# Patient Record
Sex: Male | Born: 2000 | Race: Black or African American | Hispanic: No | Marital: Single
Health system: Southern US, Community
[De-identification: ages and names within clinical notes are randomized; demographics above are authoritative.]

## PROBLEM LIST (undated history)

## (undated) DIAGNOSIS — G7100 Muscular dystrophy, unspecified: Secondary | ICD-10-CM

## (undated) DIAGNOSIS — J302 Other seasonal allergic rhinitis: Secondary | ICD-10-CM

---
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
## 2023-03-26 ENCOUNTER — Other Ambulatory Visit: Payer: Self-pay

## 2023-03-26 ENCOUNTER — Emergency Department (HOSPITAL_COMMUNITY): Payer: Medicaid Other

## 2023-03-26 ENCOUNTER — Emergency Department (HOSPITAL_COMMUNITY)
Admission: EM | Admit: 2023-03-26 | Discharge: 2023-03-26 | Disposition: A | Discharge status: Home / Self Care | Payer: Medicaid Other | Attending: Student | Admitting: Student

## 2023-03-26 ENCOUNTER — Encounter (HOSPITAL_COMMUNITY): Payer: Self-pay

## 2023-03-26 DIAGNOSIS — J101 Influenza due to other identified influenza virus with other respiratory manifestations: Secondary | ICD-10-CM | POA: Insufficient documentation

## 2023-03-26 DIAGNOSIS — Z20822 Contact with and (suspected) exposure to covid-19: Secondary | ICD-10-CM | POA: Insufficient documentation

## 2023-03-26 DIAGNOSIS — R059 Cough, unspecified: Secondary | ICD-10-CM | POA: Diagnosis present

## 2023-03-26 LAB — RESP PANEL BY RT-PCR (RSV, FLU A&B, COVID)  RVPGX2
Influenza A by PCR: POSITIVE — AB
Influenza B by PCR: NEGATIVE
Resp Syncytial Virus by PCR: NEGATIVE
SARS Coronavirus 2 by RT PCR: NEGATIVE

## 2023-03-26 LAB — COMPREHENSIVE METABOLIC PANEL
ALT: 42 U/L (ref 0–44)
AST: 36 U/L (ref 15–41)
Albumin: 4.5 g/dL (ref 3.5–5.0)
Alkaline Phosphatase: 44 U/L (ref 38–126)
Anion gap: 19 — ABNORMAL HIGH (ref 5–15)
BUN: <5 mg/dL — ABNORMAL LOW (ref 6–20)
CO2: 14 mmol/L — ABNORMAL LOW (ref 22–32)
Calcium: 9.4 mg/dL (ref 8.9–10.3)
Chloride: 106 mmol/L (ref 98–111)
Creatinine, Ser: 0.4 mg/dL — ABNORMAL LOW (ref 0.61–1.24)
GFR, Estimated: >60 mL/min (ref >60)
Glucose, Bld: 88 mg/dL (ref 70–99)
Potassium: 3.3 mmol/L — ABNORMAL LOW (ref 3.5–5.1)
Sodium: 139 mmol/L (ref 135–145)
Total Bilirubin: 1.3 mg/dL — ABNORMAL HIGH (ref 0.0–1.2)
Total Protein: 8 g/dL (ref 6.5–8.1)

## 2023-03-26 LAB — LIPASE, BLOOD: Lipase: 20 U/L (ref 11–51)

## 2023-03-26 LAB — CBC
HCT: 49.6 % (ref 39.0–52.0)
Hemoglobin: 16.8 g/dL (ref 13.0–17.0)
MCH: 30.7 pg (ref 26.0–34.0)
MCHC: 33.9 g/dL (ref 30.0–36.0)
MCV: 90.5 fL (ref 80.0–100.0)
Platelets: 264 10*3/uL (ref 150–400)
RBC: 5.48 MIL/uL (ref 4.22–5.81)
RDW: 13.1 % (ref 11.5–15.5)
WBC: 5.2 10*3/uL (ref 4.0–10.5)
nRBC: 0 % (ref 0.0–0.2)

## 2023-03-26 MED ORDER — POTASSIUM CHLORIDE CRYS ER 20 MEQ PO TBCR
40.0000 meq | EXTENDED_RELEASE_TABLET | Freq: Once | ORAL | Status: AC
  Administered 2023-03-26: 40 meq via ORAL
  Filled 2023-03-26: qty 2

## 2023-03-26 MED ORDER — MAGNESIUM OXIDE -MG SUPPLEMENT 400 (240 MG) MG PO TABS
800.0000 mg | ORAL_TABLET | Freq: Once | ORAL | Status: AC
Start: 2023-03-26 — End: 2023-03-26
  Administered 2023-03-26: 800 mg via ORAL
  Filled 2023-03-26: qty 2

## 2023-03-26 MED ORDER — ONDANSETRON 4 MG PO TBDP: 4.0000 mg | ORAL_TABLET | Freq: Three times a day (TID) | ORAL | 0 refills | Status: AC | PRN

## 2023-03-26 MED ORDER — ONDANSETRON 4 MG PO TBDP
4.0000 mg | ORAL_TABLET | Freq: Once | ORAL | Status: AC
  Administered 2023-03-26: 4 mg via ORAL
  Filled 2023-03-26: qty 1

## 2023-03-26 NOTE — ED Provider Triage Note (Signed)
 Emergency Medicine Provider Triage Evaluation Note  Terry Aguilar , a 23 y.o. male  was evaluated in triage.  Pt complains of feeling like he has had the flu for the past week since he had some similar symptoms including nausea vomiting, cough, runny nose.  States he feels like he is getting over the flu, but has still had some nausea and vomiting that has been bothersome.  Review of Systems  Positive: As above Negative: As above  Physical Exam  BP (!) 104/58 (BP Location: Left Arm)   Pulse 98   Temp 98 F (36.7 C) (Oral)   Resp 18   Wt 128.4 kg   SpO2 97%  Gen:   Awake, no distress. No  Resp:  Normal effort  MSK:   Moves extremities without difficulty    Medical Decision Making  Medically screening exam initiated at 12:51 PM.  Appropriate orders placed.  Terry Aguilar was informed that the remainder of the evaluation will be completed by another provider, this initial triage assessment does not replace that evaluation, and the importance of remaining in the ED until their evaluation is complete.     Veta Palma, PA-C 03/26/23 1253

## 2023-03-26 NOTE — ED Triage Notes (Signed)
Pt states mother had the flu recently, pt now having similar symptoms; endorses N/V, sob, cough, rhinorrhea, intermittent upper abd pain

## 2023-03-27 NOTE — ED Provider Notes (Signed)
 [place] EMERGENCY DEPARTMENT AT [facility] Provider Note  CSN: [id number] Arrival date & time: 03/26/23 1144  Chief Complaint(s) No chief complaint on file.  HPI Terry Aguilar is a 23 y.o. male with PMH muscular dystrophy who presents emergency room for evaluation of Cough, fever nausea, vomiting, cough, rhinorrhea and epigastric abdominal pain.  Mother tested positive for the flu recently.  Has been unable to tolerate p.o. 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   Past Medical History Past Medical History:  Diagnosis Date   Muscular dystrophy ([facility])    Seasonal allergies    There are no active problems to display for this patient.  Home Medication(s) Prior to Admission medications   Medication Sig Start Date End Date Taking? Authorizing Provider  carvedilol (COREG) 12.5 MG tablet Take 12.5 mg by mouth 2 (two) times daily with a meal. 01/30/23 01/30/24 Yes [provider]  JARDIANCE 10 MG TABS tablet Take 1 tablet by mouth every evening. 02/27/23  Yes [provider]  lisinopril (ZESTRIL) 10 MG tablet Take 1 tablet by mouth every evening. 03/22/22  Yes [provider]  loratadine (CLARITIN) 10 MG tablet Take 1 tablet by mouth every evening. 05/15/21  Yes [provider]  Melatonin 10 MG TABS Take 1 tablet by mouth at bedtime. 07/14/20  Yes [provider]  ondansetron  (ZOFRAN -ODT) 4 MG disintegrating tablet Take 1 tablet (4 mg total) by mouth every 8 (eight) hours as needed for nausea or vomiting. 03/26/23  Yes Vihan Santagata, MD  spironolactone (ALDACTONE) 25 MG tablet Take 1 tablet by mouth every evening. 09/12/22 09/12/23 Yes [provider]                                                                                                                                    Past Surgical History History reviewed. No pertinent surgical history. Family  History History reviewed. No pertinent family history.  Social History Social History   Tobacco Use   Smoking status: Passive Smoke Exposure - Never Smoker   Smokeless tobacco: Never  Substance Use Topics   Alcohol use: No   Drug use: No   Allergies Patient has no known allergies.  Review of Systems Review of Systems  Respiratory:  Positive for cough and shortness of breath.   Gastrointestinal:  Positive for abdominal pain, nausea and vomiting.    Physical Exam Vital Signs  I have reviewed the triage vital signs BP 103/73   Pulse (!) 59   Temp 98.2 F (36.8 C) (Oral)   Resp 18   Wt 128.4 kg   SpO2 99%   Physical Exam Vitals and nursing note reviewed.  Constitutional:      General: He is not in acute distress.    Appearance: Normal appearance. He is well-developed.  HENT:     Head: Normocephalic and atraumatic.  Nose: No congestion or rhinorrhea.  Eyes:     General:        Right eye: No discharge.        Left eye: No discharge.     Extraocular Movements: Extraocular movements intact.     Conjunctiva/sclera: Conjunctivae normal.     Pupils: Pupils are equal, round, and reactive to light.  Cardiovascular:     Rate and Rhythm: Normal rate and regular rhythm.     Heart sounds: No murmur heard. Pulmonary:     Effort: Pulmonary effort is normal. No respiratory distress.     Breath sounds: No wheezing or rales.  Abdominal:     General: There is no distension.     Tenderness: There is no abdominal tenderness.  Musculoskeletal:        General: No swelling. Normal range of motion.     Cervical back: Normal range of motion and neck supple.  Skin:    General: Skin is warm and dry.  Neurological:     General: No focal deficit present.     Mental Status: He is alert.  Psychiatric:        Mood and Affect: Mood normal.     ED Results and Treatments Labs (all labs ordered are listed, but only abnormal results are displayed) Labs Reviewed  RESP PANEL BY  RT-PCR (RSV, FLU A&B, COVID)  RVPGX2 - Abnormal; Notable for the following components:      Result Value   Influenza A by PCR POSITIVE (*)    All other components within normal limits  COMPREHENSIVE METABOLIC PANEL - Abnormal; Notable for the following components:   Potassium 3.3 (*)    CO2 14 (*)    BUN <5 (*)    Creatinine, Ser 0.40 (*)    Total Bilirubin 1.3 (*)    Anion gap 19 (*)    All other components within normal limits  LIPASE, BLOOD  CBC                                                                                                                          Radiology DG Chest 2 View Result Date: 03/26/2023 CLINICAL DATA:  Cough, shortness of breath and rhinorrhea. EXAM: CHEST - 2 VIEW COMPARISON:  None Available. FINDINGS: Lordotic positioning. Allowing for that, the heart and mediastinum are normal and the lungs are clear. No infiltrate, collapse or effusion. IMPRESSION: No active disease. Lordotic positioning. Electronically Signed   By: Oneil Officer M.D.   On: 03/26/2023 18:26    Pertinent labs & imaging results that were available during my care of the patient were reviewed by me and considered in my medical decision making (see MDM for details).  Medications Ordered in ED Medications  ondansetron  (ZOFRAN -ODT) disintegrating tablet 4 mg (4 mg Oral Given 03/26/23 1743)  potassium chloride  SA (KLOR-CON  M) CR tablet 40 mEq (40 mEq Oral Given 03/26/23 1742)  magnesium  oxide (MAG-OX) tablet 800 mg (800 mg Oral Given 03/26/23  1742)                                                                                                                                     Procedures Procedures  (including critical care time)  Medical Decision Making / ED Course   This patient presents to the ED for concern of cough, abdominal pain, vomiting, fever, this involves an extensive number of treatment options, and is a complaint that carries with it a high risk of complications and morbidity.   The differential diagnosis includes Influenzinum, COVID-19, RSV, viral gastroenteritis, pneumonia, heart failure  MDM: Patient seen emergency room for evaluation of multiple complaints as described above.  Physical exam largely unremarkable with no appreciable wheezing or rales heard on lung exam.  Minimal epigastric tenderness to palpation.  Laboratory evaluation with mild hypokalemia to 3.3 which was repleted in the ER.  ODT Zofran  given with symptomatic improvement.  Chest x-ray unremarkable with no cardiomegaly or pulmonary edema.  Patient is influenza a positive which does explain his constellation of symptoms.  He is able to tolerate p.o. in the emergency department out difficulty.  Will be discharged with ODT Zofran .  Patient is not hypoxic and low suspicion for acute CHF exacerbation.  Patient discharged with return precautions of which he and his mother voiced understanding   Additional history obtained: -Additional history obtained from mother -External records from outside source obtained and reviewed including: Chart review including previous notes, labs, imaging, consultation notes   Lab Tests: -I ordered, reviewed, and interpreted labs.   The pertinent results include:   Labs Reviewed  RESP PANEL BY RT-PCR (RSV, FLU A&B, COVID)  RVPGX2 - Abnormal; Notable for the following components:      Result Value   Influenza A by PCR POSITIVE (*)    All other components within normal limits  COMPREHENSIVE METABOLIC PANEL - Abnormal; Notable for the following components:   Potassium 3.3 (*)    CO2 14 (*)    BUN <5 (*)    Creatinine, Ser 0.40 (*)    Total Bilirubin 1.3 (*)    Anion gap 19 (*)    All other components within normal limits  LIPASE, BLOOD  CBC       Imaging Studies ordered: I ordered imaging studies including chest x-ray I independently visualized and interpreted imaging. I agree with the radiologist interpretation   Medicines ordered and prescription drug  management: Meds ordered this encounter  Medications   ondansetron  (ZOFRAN -ODT) disintegrating tablet 4 mg   potassium chloride  SA (KLOR-CON  M) CR tablet 40 mEq   magnesium  oxide (MAG-OX) tablet 800 mg   ondansetron  (ZOFRAN -ODT) 4 MG disintegrating tablet    Sig: Take 1 tablet (4 mg total) by mouth every 8 (eight) hours as needed for nausea or vomiting.    Dispense:  20 tablet    Refill:  0    -I have reviewed the patients home  medicines and have made adjustments as needed  Critical interventions none    Social Determinants of Health:  Factors impacting patients care include: none   Reevaluation: After the interventions noted above, I reevaluated the patient and found that they have :improved  Co morbidities that complicate the patient evaluation  Past Medical History:  Diagnosis Date   Muscular dystrophy (HCC)    Seasonal allergies       Dispostion: I considered admission for this patient, but at this time he does not meet inpatient criteria for admission and will be discharged with outpatient follow-up     Final Clinical Impression(s) / ED Diagnoses Final diagnoses:  Influenza A     @PCDICTATION @    Albertina Dixon, MD 03/27/23 1220
